# Patient Record
Sex: Female | Born: 1995 | Race: White | Marital: Single
Health system: Southern US, Community
[De-identification: ages and names within clinical notes are randomized; demographics above are authoritative.]

---

## 2013-11-23 ENCOUNTER — Ambulatory Visit
Admission: RE | Admit: 2013-11-23 | Discharge: 2013-11-23 | Disposition: A | Discharge status: Home / Self Care | Payer: BC Managed Care – PPO | Source: Ambulatory Visit | Attending: Family Medicine | Admitting: Family Medicine

## 2013-11-23 ENCOUNTER — Other Ambulatory Visit: Payer: Self-pay | Admitting: Family Medicine

## 2013-11-23 DIAGNOSIS — R6883 Chills (without fever): Secondary | ICD-10-CM

## 2013-11-23 DIAGNOSIS — R1031 Right lower quadrant pain: Secondary | ICD-10-CM

## 2013-11-23 DIAGNOSIS — R110 Nausea: Secondary | ICD-10-CM

## 2013-11-23 MED ORDER — IOHEXOL 300 MG/ML  SOLN
125.0000 mL | Freq: Once | INTRAMUSCULAR | Status: AC | PRN
  Administered 2013-11-23: 125 mL via INTRAVENOUS

## 2014-12-08 IMAGING — CT CT ABD-PELV W/ CM
2 of 4 series · 17 of 46 positions shown, 19 images · IV contrast (30CC OMNI 300 & [ID] OMNI 300)
Comparison: None.

CLINICAL DATA: Right lower quadrant abdominal pain, nausea and
chills.

EXAM:
CT ABDOMEN AND PELVIS WITH CONTRAST
TECHNIQUE: Multidetector CT imaging of the abdomen and pelvis was performed
using the standard protocol following bolus administration of
intravenous contrast.
CONTRAST:  125mL OMNIPAQUE IOHEXOL 300 MG/ML  SOLN

[Series 2: abd/pelvis with · axial · 0.88mm/px · z∈[-445,+5]mm · 14 of 98 slices shown, 16 images]
[im 5/98  soft-tissue]
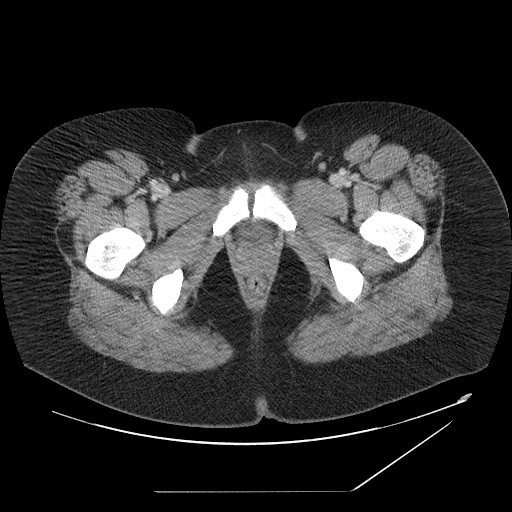
[im 5/98  bone]
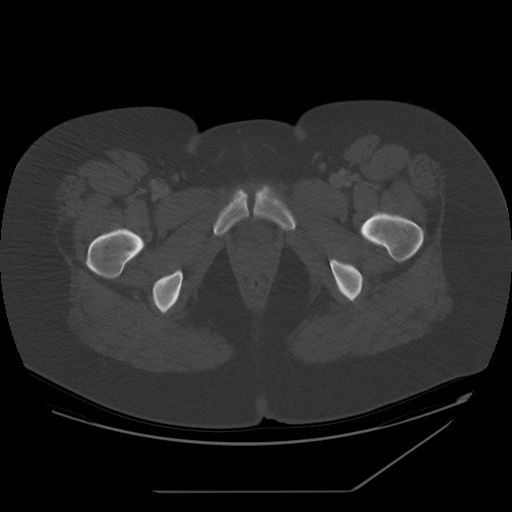
[im 13/98  soft-tissue]
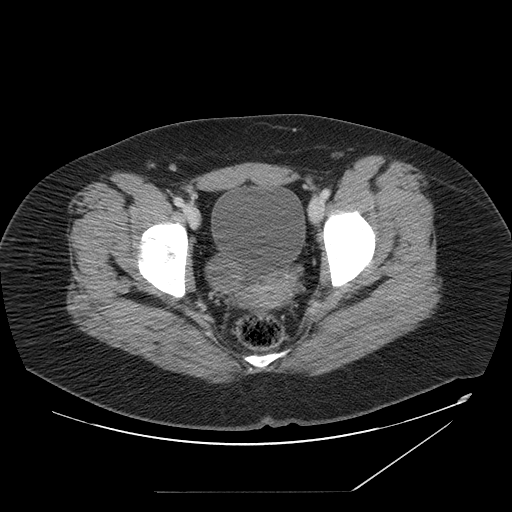
[im 21/98  soft-tissue]
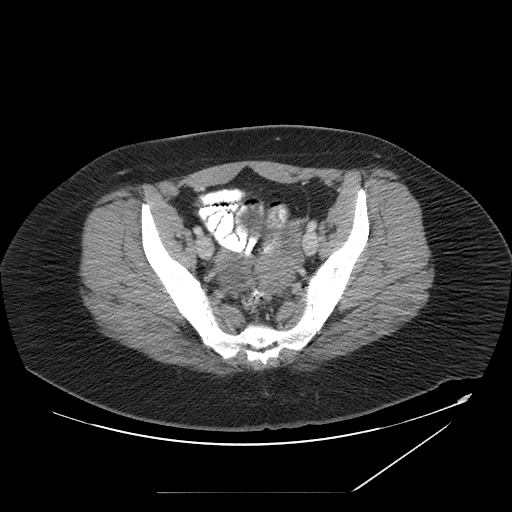
[im 25/98  soft-tissue]
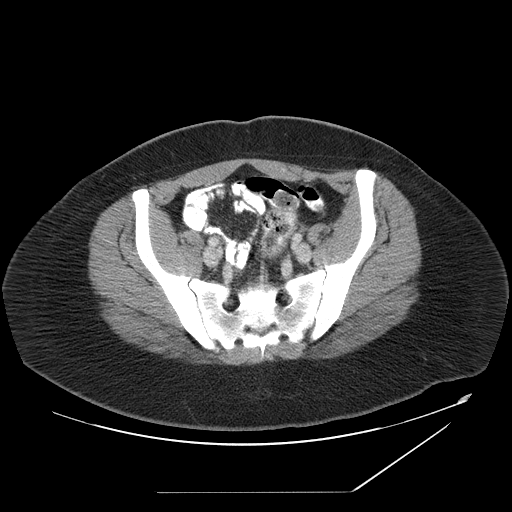
[im 33/98  soft-tissue]
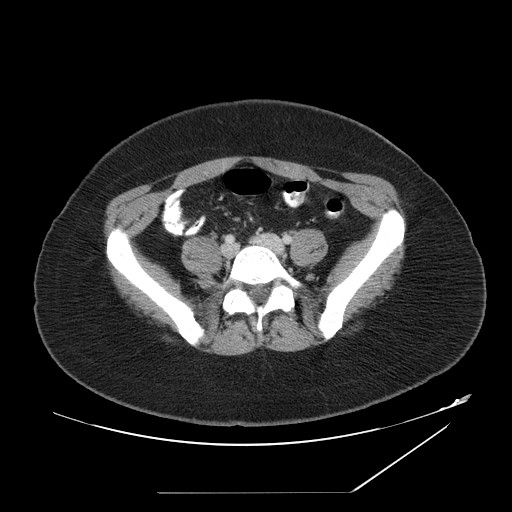
[im 41/98  soft-tissue]
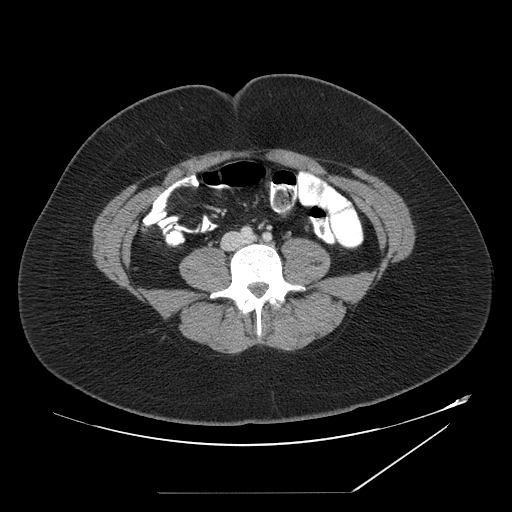
[im 45/98  soft-tissue]
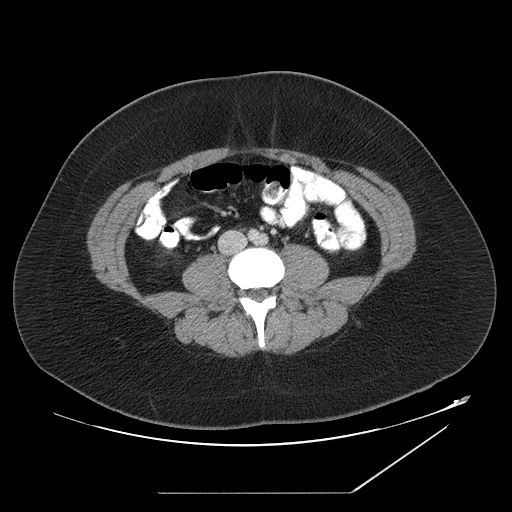
[im 53/98  soft-tissue]
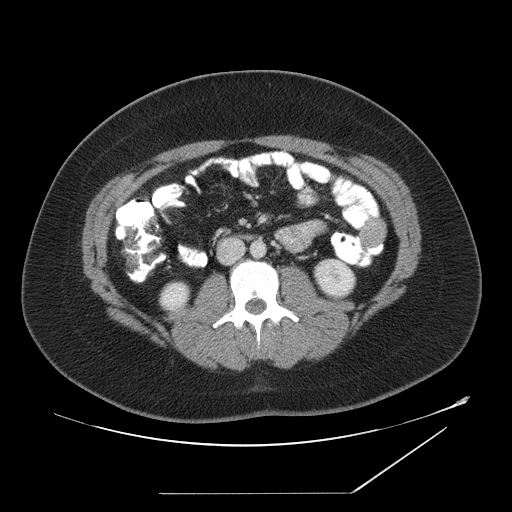
[im 57/98  soft-tissue]
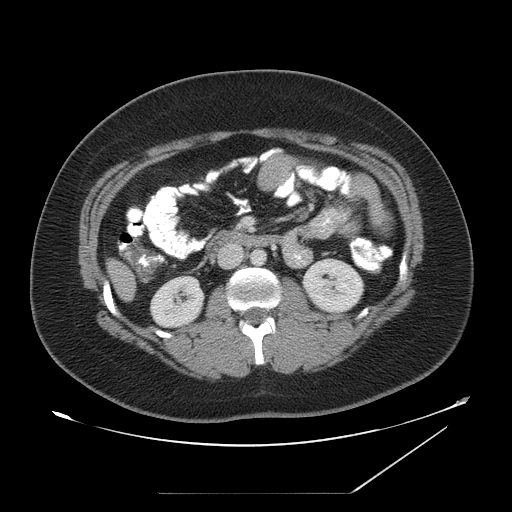
[im 57/98  bone]
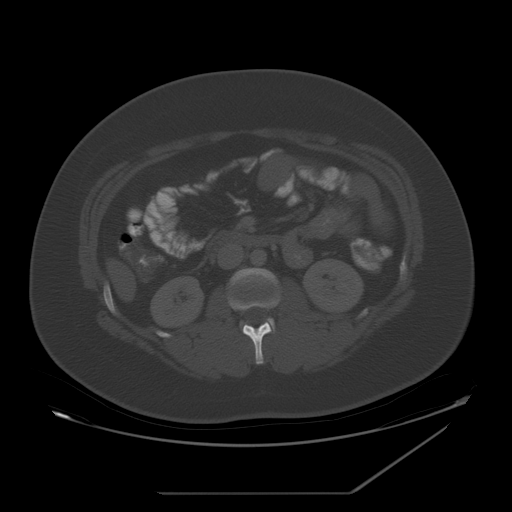
[im 65/98  soft-tissue]
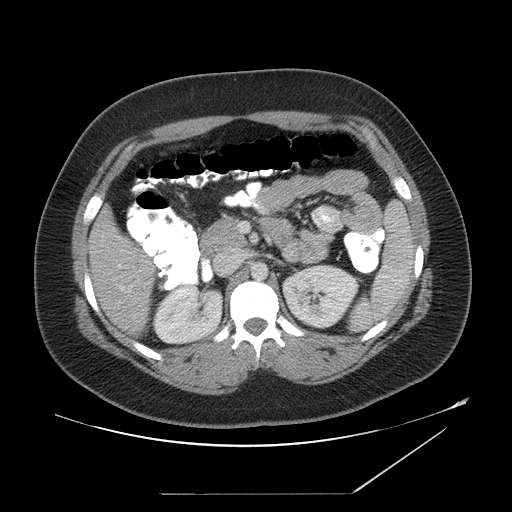
[im 73/98  soft-tissue]
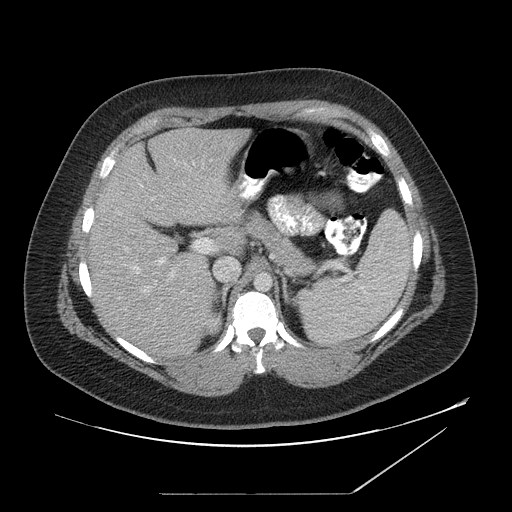
[im 77/98  soft-tissue]
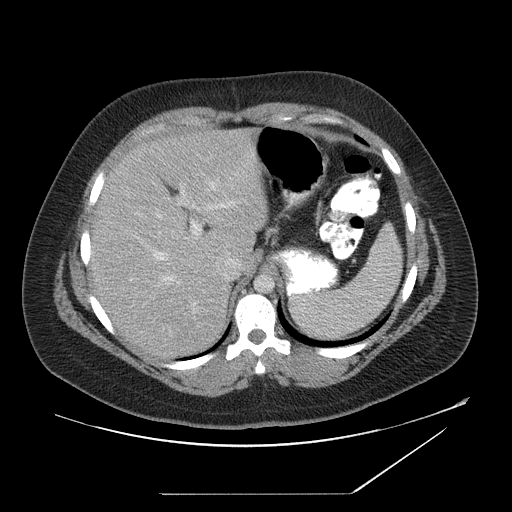
[im 85/98  soft-tissue]
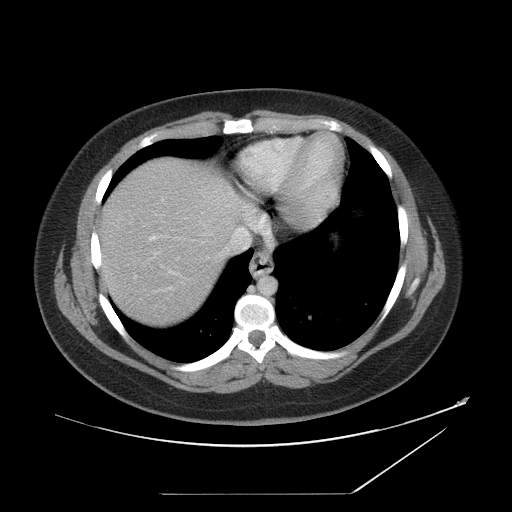
[im 93/98  soft-tissue]
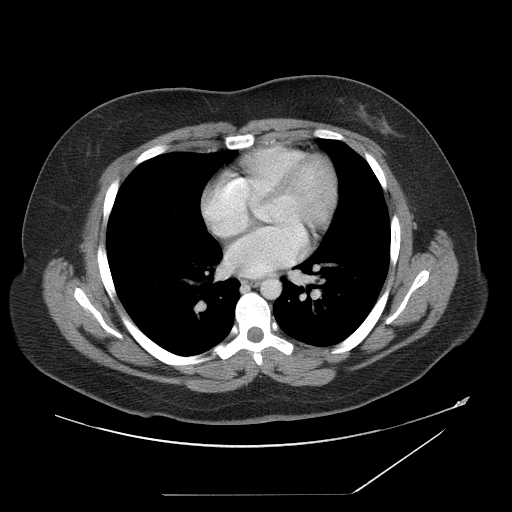

[Series 401: cor · coronal · 1.06mm/px · 3 of 131 slices shown]
[im 44/131  soft-tissue]
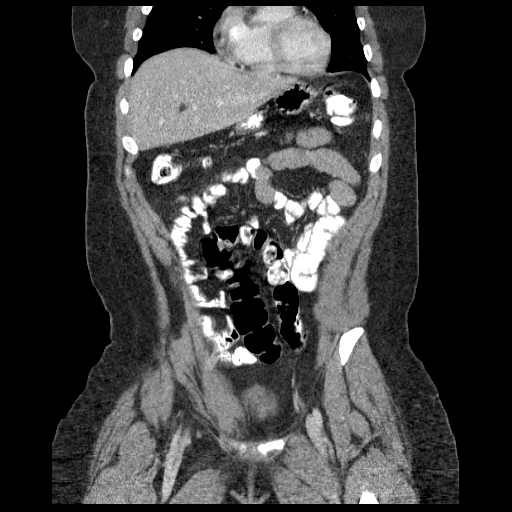
[im 58/131  soft-tissue]
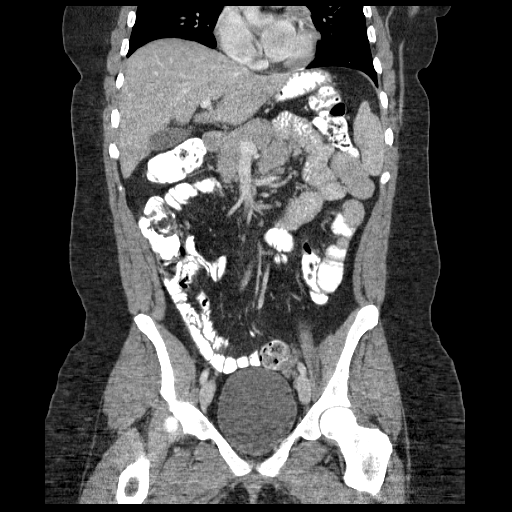
[im 73/131  soft-tissue]
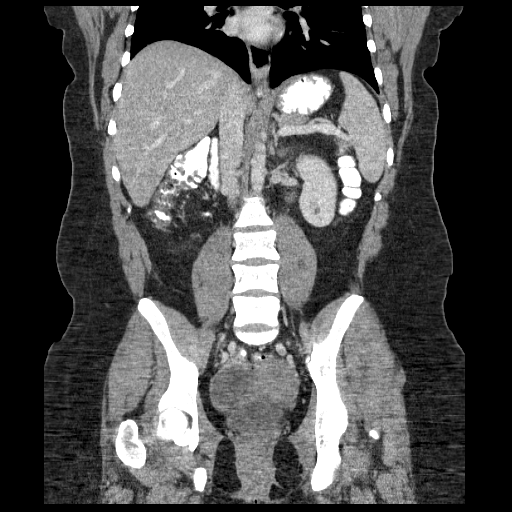

[17 of 46 positions shown; findings below may reference images not displayed]

FINDINGS: Minimal soft tissue stranding in the right lower quadrant abdominal
mesentery with minimally prominent lymph nodes in that region. The
largest node has a short axis diameter of 5 mm on image number 53.
The appendix is very small and contains a small amount of gas and
has a maximum diameter of 5 mm on image number 51.

No gastrointestinal abnormalities. Unremarkable liver, spleen,
pancreas, gallbladder, adrenal glands, kidneys, urinary bladder,
uterus and left ovary. 4.8 cm simple appearing right ovarian cyst.
Unremarkable bones. No free peritoneal fluid or air. Clear lung
bases.
IMPRESSION: 1. Mild right lower quadrant abdominal mesenteric adenitis.  .
2. Very small next without evidence of appendicitis.
3. 4.8 cm simple right ovarian cyst. This is almost certainly
benign, and no specific imaging follow up is recommended according
to the Society of Radiologists in Ultrasound 7696 Consensus
Conference Statement (Beguinot Neeamuth et al. Management of Asymptomatic
Ovarian and Other Adnexal Cysts Imaged at US: Society of
Radiologists in Ultrasound Consensus Conference Statement 7696.
Radiology [DATE]): [id number].).
